# Patient Record
Sex: Male | Born: 1975 | Race: Black or African American | Hispanic: No | Marital: Single | Smoking: Never smoker
Health system: Southern US, Community
[De-identification: ages and names within clinical notes are randomized; demographics above are authoritative.]

## PROBLEM LIST (undated history)

## (undated) HISTORY — PX: SHOULDER ARTHROSCOPY: SHX128

---

## 1998-06-10 ENCOUNTER — Emergency Department (HOSPITAL_COMMUNITY): Admission: EM | Admit: 1998-06-10 | Discharge: 1998-06-10 | Payer: Self-pay | Admitting: Emergency Medicine

## 1998-06-10 ENCOUNTER — Encounter: Payer: Self-pay | Admitting: Emergency Medicine

## 2003-10-28 ENCOUNTER — Emergency Department (HOSPITAL_COMMUNITY): Admission: EM | Admit: 2003-10-28 | Discharge: 2003-10-28 | Payer: Self-pay | Admitting: Family Medicine

## 2003-10-31 ENCOUNTER — Emergency Department (HOSPITAL_COMMUNITY): Admission: EM | Admit: 2003-10-31 | Discharge: 2003-10-31 | Payer: Self-pay | Admitting: Family Medicine

## 2004-08-18 ENCOUNTER — Emergency Department (HOSPITAL_COMMUNITY): Admission: EM | Admit: 2004-08-18 | Discharge: 2004-08-18 | Payer: Self-pay | Admitting: Emergency Medicine

## 2004-08-23 ENCOUNTER — Ambulatory Visit (HOSPITAL_COMMUNITY): Admission: RE | Admit: 2004-08-23 | Discharge: 2004-08-23 | Payer: Self-pay | Admitting: *Deleted

## 2005-04-30 ENCOUNTER — Emergency Department (HOSPITAL_COMMUNITY): Admission: EM | Admit: 2005-04-30 | Discharge: 2005-04-30 | Payer: Self-pay | Admitting: Emergency Medicine
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
## 2007-12-06 ENCOUNTER — Emergency Department (HOSPITAL_COMMUNITY): Admission: EM | Admit: 2007-12-06 | Discharge: 2007-12-06 | Payer: Self-pay | Admitting: Emergency Medicine

## 2010-08-24 NOTE — Op Note (Signed)
NAMESANJITH, SIWEK NO.:  [id number]   MEDICAL RECORD NO.:  [id number]          PATIENT TYPE:  OIB   LOCATION:  2899                         FACILITY:  [facility]   PHYSICIAN:  Tennis Must Meyerdierks, M.D.DATE OF BIRTH:  [birth year]   DATE OF PROCEDURE:  08/23/2004  DATE OF DISCHARGE:                                 OPERATIVE REPORT   PREOPERATIVE DIAGNOSIS:  Closed fracture dislocation right fourth and fifth  carpometacarpal joints.   POSTOPERATIVE DIAGNOSIS:  Closed fracture dislocation right fourth and fifth  carpometacarpal joints.   PROCEDURE:  Closed reduction and percutaneous pinning of carpometacarpal  dislocation, right fourth and fifth fingers.   SURGEON:  Lowell Bouton, M.D.   ANESTHESIA:  General.   OPERATIVE FINDINGS:  The patient had an unstable fracture dislocation of his  fourth and fifth CMC joints.   PROCEDURE:  Under general anesthesia with a tourniquet on the right arm, the  right hand was prepped and draped in usual fashion and after elevating the  limb, the tourniquet was inflated to 250 mmHg. A 45 K-wire was used to  percutaneously pin the fourth carpometacarpal joint. The pin was bent over  and left protruding from the skin. X-ray showed good position of the joint  with good stability under fluoroscopy. Sterile dressings were applied  followed by a volar wrist splint. The patient tolerated the procedure well  and went to the recovery room awake and stable in good condition.      EMM/MEDQ  D:  08/23/2004  T:  08/23/2004  Job:  [id number]

## 2010-12-02 ENCOUNTER — Emergency Department (HOSPITAL_COMMUNITY)
Admission: EM | Admit: 2010-12-02 | Discharge: 2010-12-02 | Discharge status: Against Medical Advice | Payer: Self-pay | Attending: Emergency Medicine | Admitting: Emergency Medicine

## 2016-11-25 ENCOUNTER — Encounter (HOSPITAL_COMMUNITY): Payer: Self-pay | Admitting: *Deleted

## 2016-11-25 ENCOUNTER — Emergency Department (HOSPITAL_COMMUNITY)
Admission: EM | Admit: 2016-11-25 | Discharge: 2016-11-25 | Disposition: A | Discharge status: Home / Self Care | Payer: Self-pay | Attending: Physician Assistant | Admitting: Physician Assistant

## 2016-11-25 ENCOUNTER — Emergency Department (HOSPITAL_COMMUNITY): Payer: Self-pay

## 2016-11-25 ENCOUNTER — Ambulatory Visit (HOSPITAL_COMMUNITY)
Admission: EM | Admit: 2016-11-25 | Discharge: 2016-11-25 | Disposition: A | Discharge status: Home / Self Care | Payer: Self-pay

## 2016-11-25 DIAGNOSIS — R0789 Other chest pain: Secondary | ICD-10-CM | POA: Insufficient documentation

## 2016-11-25 DIAGNOSIS — K219 Gastro-esophageal reflux disease without esophagitis: Secondary | ICD-10-CM | POA: Insufficient documentation

## 2016-11-25 LAB — CBC
HCT: 45 % (ref 39.0–52.0)
HEMOGLOBIN: 15.3 g/dL (ref 13.0–17.0)
MCH: 26.6 pg (ref 26.0–34.0)
MCHC: 34 g/dL (ref 30.0–36.0)
MCV: 78.3 fL (ref 78.0–100.0)
Platelets: 292 10*3/uL (ref 150–400)
RBC: 5.75 MIL/uL (ref 4.22–5.81)
RDW: 15.5 % (ref 11.5–15.5)
WBC: 10.1 10*3/uL (ref 4.0–10.5)

## 2016-11-25 LAB — BASIC METABOLIC PANEL
ANION GAP: 7 (ref 5–15)
BUN: 9 mg/dL (ref 6–20)
CALCIUM: 9 mg/dL (ref 8.9–10.3)
CO2: 24 mmol/L (ref 22–32)
Chloride: 106 mmol/L (ref 101–111)
Creatinine, Ser: 1.12 mg/dL (ref 0.61–1.24)
GFR calc Af Amer: >60 mL/min (ref >60)
GLUCOSE: 107 mg/dL — AB (ref 65–99)
Potassium: 4 mmol/L (ref 3.5–5.1)
SODIUM: 137 mmol/L (ref 135–145)

## 2016-11-25 LAB — I-STAT TROPONIN, ED
TROPONIN I, POC: 0 ng/mL (ref 0.00–0.08)
TROPONIN I, POC: 0 ng/mL (ref 0.00–0.08)

## 2016-11-25 MED ORDER — OMEPRAZOLE 20 MG PO CPDR: 20.0000 mg | DELAYED_RELEASE_CAPSULE | Freq: Every day | ORAL | 0 refills | Status: DC

## 2016-11-25 NOTE — ED Triage Notes (Signed)
Pt c/o L sided CP onset x 2 wks with SOB, denies n/v/d, A&O x4

## 2016-11-25 NOTE — Discharge Instructions (Signed)
We think it is likely that your symptoms are coming from gastric reflux. Please take this pill every day and follow-up with gastric urology if not improving.
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
## 2016-11-25 NOTE — ED Notes (Signed)
Left chest pain for two weeks, denies health problems, denies family history of health issues, no smoking.  Left chest pain, dull for 2 weeks.  Also complains of sob at night, difficulty sleeping.  Spoke to [employer], np.  Patient given the option based on testing needed.  Patient chose to go to ed now rather than possibly later.

## 2017-10-18 ENCOUNTER — Encounter (HOSPITAL_COMMUNITY): Payer: Self-pay | Admitting: *Deleted

## 2017-10-18 ENCOUNTER — Ambulatory Visit (HOSPITAL_COMMUNITY)
Admission: EM | Admit: 2017-10-18 | Discharge: 2017-10-18 | Disposition: A | Discharge status: Home / Self Care | Payer: Self-pay | Attending: Internal Medicine | Admitting: Internal Medicine

## 2017-10-18 DIAGNOSIS — Z9889 Other specified postprocedural states: Secondary | ICD-10-CM | POA: Insufficient documentation

## 2017-10-18 DIAGNOSIS — N4889 Other specified disorders of penis: Secondary | ICD-10-CM | POA: Insufficient documentation

## 2017-10-18 DIAGNOSIS — Z823 Family history of stroke: Secondary | ICD-10-CM | POA: Insufficient documentation

## 2017-10-18 DIAGNOSIS — A64 Unspecified sexually transmitted disease: Secondary | ICD-10-CM

## 2017-10-18 DIAGNOSIS — Z113 Encounter for screening for infections with a predominantly sexual mode of transmission: Secondary | ICD-10-CM | POA: Insufficient documentation

## 2017-10-18 LAB — POCT URINALYSIS DIP (DEVICE)
BILIRUBIN URINE: NEGATIVE
Glucose, UA: NEGATIVE mg/dL
Hgb urine dipstick: NEGATIVE
KETONES UR: NEGATIVE mg/dL
LEUKOCYTES UA: NEGATIVE
NITRITE: NEGATIVE
Protein, ur: NEGATIVE mg/dL
Specific Gravity, Urine: 1.02 (ref 1.005–1.030)
UROBILINOGEN UA: 0.2 mg/dL (ref 0.0–1.0)
pH: 6 (ref 5.0–8.0)

## 2017-10-18 MED ORDER — CEFTRIAXONE SODIUM 250 MG IJ SOLR
250.0000 mg | Freq: Once | INTRAMUSCULAR | Status: AC
  Administered 2017-10-18: 250 mg via INTRAMUSCULAR

## 2017-10-18 MED ORDER — AZITHROMYCIN 250 MG PO TABS
1000.0000 mg | ORAL_TABLET | Freq: Once | ORAL | Status: AC
  Administered 2017-10-18: 1000 mg via ORAL

## 2017-10-18 MED ORDER — CEFTRIAXONE SODIUM 250 MG IJ SOLR
INTRAMUSCULAR | Status: AC
  Filled 2017-10-18: qty 250

## 2017-10-18 MED ORDER — AZITHROMYCIN 250 MG PO TABS
ORAL_TABLET | ORAL | Status: AC
  Filled 2017-10-18: qty 4
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
## 2017-10-18 NOTE — ED Provider Notes (Signed)
[facility]    CSN: [id number] Arrival date & time: 10/18/17  1040     History   Chief Complaint Chief Complaint  Patient presents with  . Penis Pain    HPI Reginald Cole is a 42 y.o. male.   42 y.o. male presents with penile burning X 3 days. Patient states that he had sexual intercourse on Monday and the "condom broke" Condition is acute in nature. Condition is made better by nothing. Condition is made worse by nothing. Patient denies any treatment prior to there arrival at this facility. Patient states that he would like to be tested for STD. Patient declines testing for HSV at this time. Patient denies any fevers or penile discharge.       History reviewed. No pertinent past medical history.  There are no active problems to display for this patient.   Past Surgical History:  Procedure Laterality Date  . SHOULDER ARTHROSCOPY Right        Home Medications    Prior to Admission medications   Not on File    Family History Family History  Problem Relation Age of Onset  . Healthy Mother   . Stroke Father     Social History Social History   Tobacco Use  . Smoking status: Never Smoker  . Smokeless tobacco: Never Used  Substance Use Topics  . Alcohol use: No  . Drug use: No     Allergies   Patient has no known allergies.   Review of Systems Review of Systems  Constitutional: Negative for chills and fever.  HENT: Negative for ear pain and sore throat.   Eyes: Negative for pain and visual disturbance.  Respiratory: Negative for cough and shortness of breath.   Cardiovascular: Negative for chest pain and palpitations.  Gastrointestinal: Negative for abdominal pain and vomiting.  Genitourinary: Positive for penile pain ( burning). Negative for dysuria and hematuria.  Musculoskeletal: Negative for arthralgias and back pain.  Skin: Negative for color change and rash.  Neurological: Negative for seizures and syncope.  All other systems  reviewed and are negative.    Physical Exam Triage Vital Signs ED Triage Vitals  Enc Vitals Group     BP 10/18/17 1053 117/77     Pulse Rate 10/18/17 1052 (!) 53     Resp 10/18/17 1052 16     Temp 10/18/17 1052 98.3 F (36.8 C)     Temp Source 10/18/17 1052 Oral     SpO2 10/18/17 1052 98 %     Weight --      Height --      Head Circumference --      Peak Flow --      Pain Score --      Pain Loc --      Pain Edu? --      Excl. in GC? --    No data found.  Updated Vital Signs BP 117/77   Pulse (!) 53   Temp 98.3 F (36.8 C) (Oral)   Resp 16   SpO2 98%   Visual Acuity Right Eye Distance:   Left Eye Distance:   Bilateral Distance:    Right Eye Near:   Left Eye Near:    Bilateral Near:     Physical Exam  Constitutional: He is oriented to person, place, and time. He appears well-developed and well-nourished.  HENT:  Head: Normocephalic.  Neck: Normal range of motion.  Pulmonary/Chest: Effort normal.  Musculoskeletal: Normal range of motion.  Neurological:  He is alert and oriented to person, place, and time.  Skin: Skin is dry.  Psychiatric: He has a normal mood and affect.  Nursing note and vitals reviewed.    UC Treatments / Results  Labs (all labs ordered are listed, but only abnormal results are displayed) Labs Reviewed  POCT URINALYSIS DIP (DEVICE)  URINE CYTOLOGY ANCILLARY ONLY    EKG None  Radiology No results found.  Procedures Procedures (including critical care time)  Medications Ordered in UC Medications - No data to display  Initial Impression / Assessment and Plan / UC Course  I have reviewed the triage vital signs and the nursing notes.  Pertinent labs & imaging results that were available during my care of the patient were reviewed by me and considered in my medical decision making (see chart for details).      Final Clinical Impressions(s) / UC Diagnoses   Final diagnoses:  None   Discharge Instructions   None     ED Prescriptions    None     Controlled Substance Prescriptions [place] Controlled Substance Registry consulted? Not Applicable   Alene MiresOmohundro, Harles Evetts C, NP 10/18/17 1840

## 2017-10-20 LAB — URINE CYTOLOGY ANCILLARY ONLY
Chlamydia: NEGATIVE
Neisseria Gonorrhea: NEGATIVE
Trichomonas: NEGATIVE

## 2017-10-21 LAB — URINE CYTOLOGY ANCILLARY ONLY
BACTERIAL VAGINITIS: NEGATIVE
Candida vaginitis: NEGATIVE

## 2019-03-19 ENCOUNTER — Ambulatory Visit (HOSPITAL_COMMUNITY)
Admission: EM | Admit: 2019-03-19 | Discharge: 2019-03-19 | Disposition: A | Discharge status: Home / Self Care | Payer: Self-pay | Attending: Family Medicine | Admitting: Family Medicine

## 2019-03-19 ENCOUNTER — Other Ambulatory Visit: Payer: Self-pay

## 2019-03-19 ENCOUNTER — Encounter (HOSPITAL_COMMUNITY): Payer: Self-pay | Admitting: Emergency Medicine

## 2019-03-19 DIAGNOSIS — R300 Dysuria: Secondary | ICD-10-CM | POA: Insufficient documentation

## 2019-03-19 DIAGNOSIS — Z113 Encounter for screening for infections with a predominantly sexual mode of transmission: Secondary | ICD-10-CM | POA: Insufficient documentation

## 2019-03-19 DIAGNOSIS — Z202 Contact with and (suspected) exposure to infections with a predominantly sexual mode of transmission: Secondary | ICD-10-CM

## 2019-03-19 DIAGNOSIS — R109 Unspecified abdominal pain: Secondary | ICD-10-CM | POA: Insufficient documentation

## 2019-03-19 LAB — POCT URINALYSIS DIP (DEVICE)
Bilirubin Urine: NEGATIVE
Glucose, UA: NEGATIVE mg/dL
Ketones, ur: NEGATIVE mg/dL
Nitrite: NEGATIVE
Protein, ur: NEGATIVE mg/dL
Specific Gravity, Urine: 1.025 (ref 1.005–1.030)
Urobilinogen, UA: 0.2 mg/dL (ref 0.0–1.0)
pH: 6 (ref 5.0–8.0)

## 2019-03-19 MED ORDER — CEFTRIAXONE SODIUM 250 MG IJ SOLR
INTRAMUSCULAR | Status: AC
  Filled 2019-03-19: qty 250

## 2019-03-19 MED ORDER — AZITHROMYCIN 250 MG PO TABS
ORAL_TABLET | ORAL | Status: AC
  Filled 2019-03-19: qty 4

## 2019-03-19 MED ORDER — AZITHROMYCIN 250 MG PO TABS
1000.0000 mg | ORAL_TABLET | Freq: Once | ORAL | Status: AC
  Administered 2019-03-19: 12:00:00 1000 mg via ORAL

## 2019-03-19 MED ORDER — CEFTRIAXONE SODIUM 250 MG IJ SOLR
250.0000 mg | Freq: Once | INTRAMUSCULAR | Status: AC
  Administered 2019-03-19: 13:00:00 250 mg via INTRAMUSCULAR

## 2019-03-19 MED ORDER — LIDOCAINE HCL (PF) 1 % IJ SOLN
INTRAMUSCULAR | Status: AC
  Filled 2019-03-19: qty 2

## 2019-03-19 NOTE — ED Provider Notes (Signed)
[facility]    CSN: [id number] Arrival date & time: 03/19/19  1113      History   Chief Complaint Chief Complaint  Patient presents with  . Exposure to STD    HPI Reginald Cole is a 43 y.o. male significant past medical history presenting today for evaluation of left flank discomfort and dysuria.  Patient states that for the past couple days he has had a tingling sensation in his left flank.  He has also had a lot of irritation and burning with urination in the past 24 hours.  He denies any discharge.  He is concerned about STDs as he notes that he recently had intercourse where the condom broke.  He denies any testicular pain or swelling.  Denies radiation of tingling sensation to groin.  Denies any rashes or lesions.  Denies history of any prostate issues.  Denies history of kidney stones.  Denies hematuria.  HPI  History reviewed. No pertinent past medical history.  There are no problems to display for this patient.   Past Surgical History:  Procedure Laterality Date  . SHOULDER ARTHROSCOPY Right        Home Medications    Prior to Admission medications   Not on File    Family History Family History  Problem Relation Age of Onset  . Healthy Mother   . Stroke Father     Social History Social History   Tobacco Use  . Smoking status: Never Smoker  . Smokeless tobacco: Never Used  Substance Use Topics  . Alcohol use: No  . Drug use: No     Allergies   Patient has no known allergies.   Review of Systems Review of Systems  Constitutional: Negative for fever.  HENT: Negative for sore throat.   Respiratory: Negative for shortness of breath.   Cardiovascular: Negative for chest pain.  Gastrointestinal: Negative for abdominal pain, nausea and vomiting.  Genitourinary: Positive for dysuria and flank pain. Negative for difficulty urinating, discharge, frequency, penile pain, penile swelling, scrotal swelling and testicular pain.  Skin:  Negative for rash.  Neurological: Negative for dizziness, light-headedness and headaches.     Physical Exam Triage Vital Signs ED Triage Vitals  Enc Vitals Group     BP 03/19/19 1127 137/90     Pulse Rate 03/19/19 1127 65     Resp 03/19/19 1127 16     Temp 03/19/19 1127 98.1 F (36.7 C)     Temp Source 03/19/19 1127 Oral     SpO2 03/19/19 1127 95 %     Weight --      Height --      Head Circumference --      Peak Flow --      Pain Score 03/19/19 1128 0     Pain Loc --      Pain Edu? --      Excl. in GC? --    No data found.  Updated Vital Signs BP 137/90 (BP Location: Left Arm)   Pulse 65   Temp 98.1 F (36.7 C) (Oral)   Resp 16   SpO2 95%   Visual Acuity Right Eye Distance:   Left Eye Distance:   Bilateral Distance:    Right Eye Near:   Left Eye Near:    Bilateral Near:     Physical Exam Vitals and nursing note reviewed.  Constitutional:      Appearance: He is well-developed.     Comments: No acute distress  HENT:  Head: Normocephalic and atraumatic.     Nose: Nose normal.  Eyes:     Conjunctiva/sclera: Conjunctivae normal.  Cardiovascular:     Rate and Rhythm: Normal rate.  Pulmonary:     Effort: Pulmonary effort is normal. No respiratory distress.     Comments: Breathing comfortably at rest, CTABL, no wheezing, rales or other adventitious sounds auscultated Abdominal:     General: There is no distension.     Comments: Soft, nondistended, nontender to light deep palpation throughout abdomen  Genitourinary:    Comments: No urethral erythema or discharge noted Musculoskeletal:        General: Normal range of motion.     Cervical back: Neck supple.     Comments: Nontender to palpation to bilateral flanks or lower lumbar areas  Skin:    General: Skin is warm and dry.  Neurological:     Mental Status: He is alert and oriented to person, place, and time.      UC Treatments / Results  Labs (all labs ordered are listed, but only abnormal  results are displayed) Labs Reviewed  POCT URINALYSIS DIP (DEVICE) - Abnormal; Notable for the following components:      Result Value   Hgb urine dipstick TRACE (*)    Leukocytes,Ua SMALL (*)    All other components within normal limits  URINE CULTURE  CYTOLOGY, (ORAL, ANAL, URETHRAL) ANCILLARY ONLY    EKG   Radiology No results found.  Procedures Procedures (including critical care time)  Medications Ordered in UC Medications  azithromycin (ZITHROMAX) tablet 1,000 mg (has no administration in time range)  cefTRIAXone (ROCEPHIN) injection 250 mg (has no administration in time range)    Initial Impression / Assessment and Plan / UC Course  I have reviewed the triage vital signs and the nursing notes.  Pertinent labs & imaging results that were available during my care of the patient were reviewed by me and considered in my medical decision making (see chart for details).     Trace hemoglobin, small leuks, will send for culture to confirm, and rule out UTI, along with urethral swab for STDs.  Empirically treating today for gonorrhea and chlamydia based off history.  Rocephin and azithromycin prior to discharge.  Will call with results and provide further treatment as needed.  Continue to monitor,Discussed strict return precautions. Patient verbalized understanding and is agreeable with plan.  Final Clinical Impressions(s) / UC Diagnoses   Final diagnoses:  Dysuria  Screen for STD (sexually transmitted disease)  Left flank discomfort     Discharge Instructions     We have treated you today for gonorrhea and chlamydia, with rocephin and azithromycin. Please refrain from sexual activity for 7 days while medicine is clearing infection.  We are testing you for Gonorrhea, Chlamydia and Trichomonas. We will call you if anything is positive and let you know if you require any further treatment. Please inform partner of any positive results.  Please return if symptoms not  improving with treatment, development of fever, nausea, vomiting, abdominal pain, scrotal pain.     ED Prescriptions    None     PDMP not reviewed this encounter.   Janith Lima, PA-C 03/19/19 1218

## 2019-03-19 NOTE — Discharge Instructions (Signed)
We have treated you today for gonorrhea and chlamydia, with rocephin and azithromycin. Please refrain from sexual activity for 7 days while medicine is clearing infection. ° °We are testing you for Gonorrhea, Chlamydia and Trichomonas. We will call you if anything is positive and let you know if you require any further treatment. Please inform partner of any positive results. ° °Please return if symptoms not improving with treatment, development of fever, nausea, vomiting, abdominal pain, scrotal pain. °
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
## 2019-03-20 LAB — URINE CULTURE: Culture: <10000 — AB

## 2019-03-23 ENCOUNTER — Telehealth (HOSPITAL_COMMUNITY): Payer: Self-pay | Admitting: Emergency Medicine

## 2019-03-23 LAB — CYTOLOGY, (ORAL, ANAL, URETHRAL) ANCILLARY ONLY
Chlamydia: NEGATIVE
Neisseria Gonorrhea: POSITIVE — AB
Trichomonas: NEGATIVE

## 2019-03-23 NOTE — Telephone Encounter (Signed)
Test for gonorrhea was positive. This was treated at the urgent care visit with IM rocephin 250mg and po zithromax 1g. Pt needs education to refrain from sexual intercourse for 7 days after treatment to give the medicine time to work. Sexual partners need to be notified and tested/treated. Condoms may reduce risk of reinfection. Recheck or followup with PCP for further evaluation if symptoms are not improving. [facility] notified.  Patient contacted by phone and made aware of    results. Pt verbalized understanding and had all questions answered.
